# Patient Record
Sex: Female | Born: 1955 | Race: White | Hispanic: No | Smoking: Current every day smoker
Health system: Southern US, Community
[De-identification: ages and names within clinical notes are randomized; demographics above are authoritative.]

## PROBLEM LIST (undated history)

## (undated) DIAGNOSIS — Z86010 Personal history of colonic polyps: Secondary | ICD-10-CM

## (undated) DIAGNOSIS — J449 Chronic obstructive pulmonary disease, unspecified: Secondary | ICD-10-CM

## (undated) DIAGNOSIS — Z8619 Personal history of other infectious and parasitic diseases: Secondary | ICD-10-CM

## (undated) DIAGNOSIS — K219 Gastro-esophageal reflux disease without esophagitis: Secondary | ICD-10-CM

## (undated) DIAGNOSIS — W458XXS Other foreign body or object entering through skin, sequela: Secondary | ICD-10-CM

## (undated) DIAGNOSIS — J45909 Unspecified asthma, uncomplicated: Secondary | ICD-10-CM

## (undated) DIAGNOSIS — E039 Hypothyroidism, unspecified: Secondary | ICD-10-CM

## (undated) HISTORY — DX: Chronic obstructive pulmonary disease, unspecified: J44.9

## (undated) HISTORY — DX: Personal history of other infectious and parasitic diseases: Z86.19

## (undated) HISTORY — DX: Gastro-esophageal reflux disease without esophagitis: K21.9

## (undated) HISTORY — DX: Hypothyroidism, unspecified: E03.9

## (undated) HISTORY — PX: CERVICAL CONIZATION W/BX: SHX1330

## (undated) HISTORY — DX: Personal history of colonic polyps: Z86.010

## (undated) HISTORY — DX: Other foreign body or object entering through skin, sequela: W45.8XXS

## (undated) HISTORY — DX: Unspecified asthma, uncomplicated: J45.909

---
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
## 1986-03-21 HISTORY — PX: DILITATION & CURRETTAGE/HYSTROSCOPY WITH ESSURE: SHX5573

## 1998-03-21 HISTORY — PX: SPINE SURGERY: SHX786

## 2013-03-21 DIAGNOSIS — Z86010 Personal history of colonic polyps: Secondary | ICD-10-CM

## 2013-03-21 HISTORY — DX: Personal history of colonic polyps: Z86.010

## 2015-02-18 ENCOUNTER — Encounter: Payer: Self-pay | Admitting: Family

## 2015-02-18 ENCOUNTER — Ambulatory Visit (INDEPENDENT_AMBULATORY_CARE_PROVIDER_SITE_OTHER): Payer: Managed Care, Other (non HMO) | Admitting: Family

## 2015-02-18 VITALS — BP 112/61 | HR 78 | Temp 98.2°F | Resp 16 | Ht 64.0 in | Wt 112.0 lb

## 2015-02-18 DIAGNOSIS — Z720 Tobacco use: Secondary | ICD-10-CM | POA: Diagnosis not present

## 2015-02-18 DIAGNOSIS — Z23 Encounter for immunization: Secondary | ICD-10-CM

## 2015-02-18 DIAGNOSIS — K219 Gastro-esophageal reflux disease without esophagitis: Secondary | ICD-10-CM

## 2015-02-18 DIAGNOSIS — J449 Chronic obstructive pulmonary disease, unspecified: Secondary | ICD-10-CM | POA: Diagnosis not present

## 2015-02-18 DIAGNOSIS — E039 Hypothyroidism, unspecified: Secondary | ICD-10-CM

## 2015-02-18 MED ORDER — BUDESONIDE-FORMOTEROL FUMARATE 80-4.5 MCG/ACT IN AERO: 2.0000 | INHALATION_SPRAY | Freq: Two times a day (BID) | RESPIRATORY_TRACT | Status: AC

## 2015-02-18 MED ORDER — ALBUTEROL SULFATE HFA 108 (90 BASE) MCG/ACT IN AERS: 2.0000 | INHALATION_SPRAY | Freq: Four times a day (QID) | RESPIRATORY_TRACT | Status: AC | PRN

## 2015-02-18 NOTE — Progress Notes (Signed)
Subjective:    Patient ID: Jillian Sanford, female    DOB: 06/10/1955, 59 y.o.   MRN: [id number]  HPI  Jillian Sanford is a 59 yr old female who presents today to establish care. Has followed at [facility].    Pmhx is significant for the following:  COPD- reports that she was recently treated for pneumonia. She is requesting pneumovax.  Hypothyroid- currently maintained on synthroid. Was diagnosed 2004.  Has been stable on synthroid 100.    gerd-  She reports that she has had endoscopy and was told that she has Hiatal Hernia.  She uses otc omeprazole  once daily prn. Tries to avoid trigger foods.   Pneumonia end of June.   Tobacco abuse- began 20. Has smoked 3 PPD for a period of time, but 1PPD for many years.  Now smoking 12 cig a day  Review of Systems  Constitutional: Negative for unexpected weight change.  HENT: Negative for hearing loss and rhinorrhea.   Eyes:       Wears glasses  Respiratory:       Smokers cough at baseline  Cardiovascular: Negative for leg swelling.  Gastrointestinal: Positive for constipation. Negative for diarrhea.       Uses miralax almost daily, drinks a lot of water  Neurological:       Reports that she has some right sided HA's which she attributes to dental issue (needs filling replaced)  Hematological: Negative for adenopathy.  Psychiatric/Behavioral:       Denies depression/anxiety   Past Medical History  Diagnosis Date  . Asthma   . COPD (chronic obstructive pulmonary disease) (HCC)   . GERD (gastroesophageal reflux disease)   . History of genital warts     resolved  . Hypothyroid   . History of colon polyps 2015    Sees Dr. Jasmine December  . Other foreign body or object entering through skin, sequela 1969    has tip of sewing needle in right knee    Social History   Social History  . Marital Status: Legally Separated    Spouse Name: N/A  . Number of Children: N/A  . Years of Education: N/A   Occupational History  . Not on  file.   Social History Main Topics  . Smoking status: Current Every Day Smoker -- 25 years  . Smokeless tobacco: Not on file     Comment: 10 cigarettes a day  . Alcohol Use: No  . Drug Use: No  . Sexual Activity: Not on file   Other Topics Concern  . Not on file   Social History Narrative   4 children- all daughters, 2 at home and 2 out of the house   She has 3 grand children and 1 great grandchild   Customer service specialist at [employer]   About to become divorced   Enjoys shopping.    Past Surgical History  Procedure Laterality Date  . Cervical conization w/bx  1986, 1988  . Spine surgery  2000    "slipped disc lower spine"  . Dilitation & currettage/hystroscopy with essure  1988    ectopic pregnancy    Family History  Problem Relation Age of Onset  . Arthritis Mother     rheumatoid  . Hypertension Mother   . Cancer Sister 24    uterine cancer  . Cancer Brother     lymphoma?  . Cancer Paternal Uncle     colon  . Arthritis Maternal Grandmother   . Diabetes  Grandchild     Allergies  Allergen Reactions  . Penicillins Swelling and Rash    Throat swelling    No current outpatient prescriptions on file prior to visit.   No current facility-administered medications on file prior to visit.    BP 112/61 mmHg  Pulse 78  Temp(Src) 98.2 F (36.8 C) (Oral)  Resp 16  Ht 5\' 4"  (1.626 m)  Wt 112 lb (50.803 kg)  BMI 19.22 kg/m2  SpO2 100%       Objective:   Physical Exam  Constitutional: She is oriented to person, place, and time. She appears well-developed and well-nourished.  HENT:  Head: Normocephalic and atraumatic.  Right Ear: Tympanic membrane and ear canal normal.  Left Ear: Tympanic membrane and ear canal normal.  Mouth/Throat: No oropharyngeal exudate or posterior oropharyngeal edema.  Eyes: No scleral icterus.  Cardiovascular: Normal rate, regular rhythm and normal heart sounds.   No murmur heard. Pulmonary/Chest: Effort  normal and breath sounds normal. No respiratory distress. She has no wheezes.  Abdominal: Soft. She exhibits no distension. There is no tenderness.  Musculoskeletal: She exhibits no edema.  Lymphadenopathy:    She has no cervical adenopathy.  Neurological: She is alert and oriented to person, place, and time.  Skin: Skin is warm and dry.  Psychiatric: She has a normal mood and affect. Her behavior is normal. Judgment and thought content normal.          Assessment & Plan:

## 2015-02-18 NOTE — Progress Notes (Signed)
Pre visit review using our clinic review tool, if applicable. No additional management support is needed unless otherwise documented below in the visit note. 

## 2015-02-18 NOTE — Patient Instructions (Addendum)
Please complete lab work prior to leaving. Stop dulera and proventil- start symbicort and proair.

## 2015-02-19 DIAGNOSIS — E039 Hypothyroidism, unspecified: Secondary | ICD-10-CM | POA: Insufficient documentation

## 2015-02-19 DIAGNOSIS — Z720 Tobacco use: Secondary | ICD-10-CM | POA: Insufficient documentation

## 2015-02-19 DIAGNOSIS — J449 Chronic obstructive pulmonary disease, unspecified: Secondary | ICD-10-CM | POA: Insufficient documentation

## 2015-02-19 DIAGNOSIS — K219 Gastro-esophageal reflux disease without esophagitis: Secondary | ICD-10-CM | POA: Insufficient documentation

## 2015-02-19 LAB — TSH: TSH: 0.4 u[IU]/mL (ref 0.35–4.50)

## 2015-02-19 NOTE — Assessment & Plan Note (Signed)
Clinically stable on synthroid, obtain TSH.  

## 2015-02-19 NOTE — Assessment & Plan Note (Signed)
Managed with dietary modification and prn otc prilosec.

## 2015-02-19 NOTE — Assessment & Plan Note (Signed)
Pt counseled on importance of smoking cessation.  She is not yet ready to quit.  Will refer for low dose CT chest for lung cancer screening.

## 2015-02-19 NOTE — Assessment & Plan Note (Signed)
D/c dulera and proventil for insurance purposes. Start symbicort and proair. Stable.

## 2015-02-22 ENCOUNTER — Other Ambulatory Visit: Payer: Self-pay | Admitting: Family

## 2015-02-22 NOTE — Telephone Encounter (Signed)
Lab work shows synthroid dose a little strong. I would like her to decrease to , repeat tsh in 6 weeks.

## 2015-02-23 NOTE — Telephone Encounter (Signed)
Left message for pt to return my call.

## 2015-02-24 ENCOUNTER — Encounter: Payer: Self-pay | Admitting: Family

## 2015-02-24 ENCOUNTER — Telehealth: Payer: Self-pay | Admitting: Family

## 2015-02-24 DIAGNOSIS — E039 Hypothyroidism, unspecified: Secondary | ICD-10-CM

## 2015-02-24 DIAGNOSIS — Z8701 Personal history of pneumonia (recurrent): Secondary | ICD-10-CM

## 2015-02-24 NOTE — Telephone Encounter (Signed)
Insurance declined screening chest CT.  Can we please request copy of records from her pneumonia episode back in June- specifically lung imaging?

## 2015-02-25 MED ORDER — LEVOTHYROXINE SODIUM 88 MCG PO TABS: 88.0000 ug | ORAL_TABLET | Freq: Every day | ORAL | Status: DC

## 2015-02-25 NOTE — Telephone Encounter (Signed)
Pt notified. See 02/24/15 patient email.

## 2015-02-25 NOTE — Telephone Encounter (Signed)
Sent mychart message to pt. Awaiting response. 

## 2015-02-27 ENCOUNTER — Telehealth: Payer: Self-pay | Admitting: Family

## 2015-02-27 ENCOUNTER — Ambulatory Visit (HOSPITAL_BASED_OUTPATIENT_CLINIC_OR_DEPARTMENT_OTHER)
Admission: RE | Admit: 2015-02-27 | Discharge: 2015-02-27 | Disposition: A | Discharge status: Home / Self Care | Payer: Managed Care, Other (non HMO) | Source: Ambulatory Visit | Attending: Family | Admitting: Family

## 2015-02-27 DIAGNOSIS — R918 Other nonspecific abnormal finding of lung field: Secondary | ICD-10-CM | POA: Insufficient documentation

## 2015-02-27 DIAGNOSIS — J45909 Unspecified asthma, uncomplicated: Secondary | ICD-10-CM | POA: Diagnosis not present

## 2015-02-27 DIAGNOSIS — J449 Chronic obstructive pulmonary disease, unspecified: Secondary | ICD-10-CM | POA: Insufficient documentation

## 2015-02-27 DIAGNOSIS — Z8701 Personal history of pneumonia (recurrent): Secondary | ICD-10-CM | POA: Insufficient documentation

## 2015-02-27 DIAGNOSIS — R05 Cough: Secondary | ICD-10-CM | POA: Diagnosis not present

## 2015-02-27 DIAGNOSIS — J9811 Atelectasis: Secondary | ICD-10-CM

## 2015-02-27 DIAGNOSIS — F17200 Nicotine dependence, unspecified, uncomplicated: Secondary | ICD-10-CM | POA: Diagnosis not present

## 2015-02-27 NOTE — Telephone Encounter (Signed)
X ray neg for pneumonia.  Note is made of some atelectasis (where some of the air sacs of the lung are deflated).  I would like her to repeat CXR in 3 weeks.

## 2015-02-27 NOTE — Telephone Encounter (Signed)
Left detailed message on pt's cell# re: below recommendation and to call if any questions. 

## 2015-02-27 NOTE — Telephone Encounter (Signed)
I reviewed her old x ray. We don't need CT at this point, just a follow up x ray. Order placed.

## 2015-02-27 NOTE — Telephone Encounter (Signed)
CXR result received and placed in PCP's yellow folder.

## 2015-02-27 NOTE — Telephone Encounter (Signed)
Sent mychart message to pt. 

## 2015-03-04 ENCOUNTER — Telehealth: Payer: Self-pay | Admitting: Family

## 2015-03-04 DIAGNOSIS — Z8701 Personal history of pneumonia (recurrent): Secondary | ICD-10-CM

## 2015-03-04 NOTE — Telephone Encounter (Signed)
Please advise below message? Last phone note said pt should have repeat CXR in 3 weeks?

## 2015-03-04 NOTE — Telephone Encounter (Signed)
I changed CT order.

## 2015-03-05 NOTE — Telephone Encounter (Signed)
Lets hold off on CT and plan to follow through with CXR in 3 weeks first. Thanks.

## 2015-03-05 NOTE — Telephone Encounter (Signed)
Imaging aware on hold

## 2015-03-20 ENCOUNTER — Ambulatory Visit (HOSPITAL_BASED_OUTPATIENT_CLINIC_OR_DEPARTMENT_OTHER)
Admission: RE | Admit: 2015-03-20 | Discharge: 2015-03-20 | Disposition: A | Discharge status: Home / Self Care | Payer: Managed Care, Other (non HMO) | Source: Ambulatory Visit | Attending: Family | Admitting: Family

## 2015-03-20 DIAGNOSIS — J449 Chronic obstructive pulmonary disease, unspecified: Secondary | ICD-10-CM | POA: Insufficient documentation

## 2015-03-20 DIAGNOSIS — J189 Pneumonia, unspecified organism: Secondary | ICD-10-CM | POA: Insufficient documentation

## 2015-03-20 DIAGNOSIS — J9811 Atelectasis: Secondary | ICD-10-CM | POA: Diagnosis present

## 2015-03-23 ENCOUNTER — Encounter: Payer: Self-pay | Admitting: Family

## 2015-04-03 ENCOUNTER — Encounter: Payer: Self-pay | Admitting: Family

## 2015-04-06 NOTE — Telephone Encounter (Signed)
Called pt. LVM for call back to reschedule lab appt.

## 2015-04-08 ENCOUNTER — Other Ambulatory Visit: Payer: Managed Care, Other (non HMO)

## 2015-04-14 NOTE — Telephone Encounter (Signed)
Pt left VM 1/23 3:54pm to schedule lab appt - called and left msg for her to contact us and wait on the line to schedule with a team member

## 2015-04-16 ENCOUNTER — Other Ambulatory Visit (INDEPENDENT_AMBULATORY_CARE_PROVIDER_SITE_OTHER): Payer: Managed Care, Other (non HMO)

## 2015-04-16 DIAGNOSIS — E039 Hypothyroidism, unspecified: Secondary | ICD-10-CM

## 2015-04-16 LAB — TSH: TSH: 15.5 u[IU]/mL — AB (ref 0.35–4.50)

## 2015-04-17 ENCOUNTER — Other Ambulatory Visit: Payer: Self-pay | Admitting: Family

## 2015-04-17 DIAGNOSIS — E039 Hypothyroidism, unspecified: Secondary | ICD-10-CM

## 2015-04-17 NOTE — Telephone Encounter (Signed)
Left message for pt to return my call.

## 2015-04-17 NOTE — Telephone Encounter (Signed)
Please let pt know that blood work shows thyroid medication is too low.  Please confirm that she has been taking and if so, lets increase from 88 mcg to 112 mcg, follow up tsh in 6 weeks dx hypothyroid.

## 2015-04-20 MED ORDER — LEVOTHYROXINE SODIUM 112 MCG PO TABS: 112.0000 ug | ORAL_TABLET | Freq: Every day | ORAL | Status: AC

## 2015-04-20 NOTE — Telephone Encounter (Signed)
Pt notified. Rx sent, lab appt scheduled for 06/02/15 at 8:45am and lab order entered.

## 2015-04-20 NOTE — Telephone Encounter (Signed)
Pt returned call and would like to be call back at [phone] (home phone)

## 2015-06-02 ENCOUNTER — Other Ambulatory Visit: Payer: Managed Care, Other (non HMO)

## 2017-09-30 IMAGING — DX DG CHEST 2V
2 series · 2 of 2 positions shown · non-contrast
Comparison: None in PACs

CLINICAL DATA: Onset of coughing 4 days ago, current smoker,
history of pneumonia in August 2014, history of asthma-Celsius OPD

EXAM:
CHEST  2 VIEW

[chest pa]
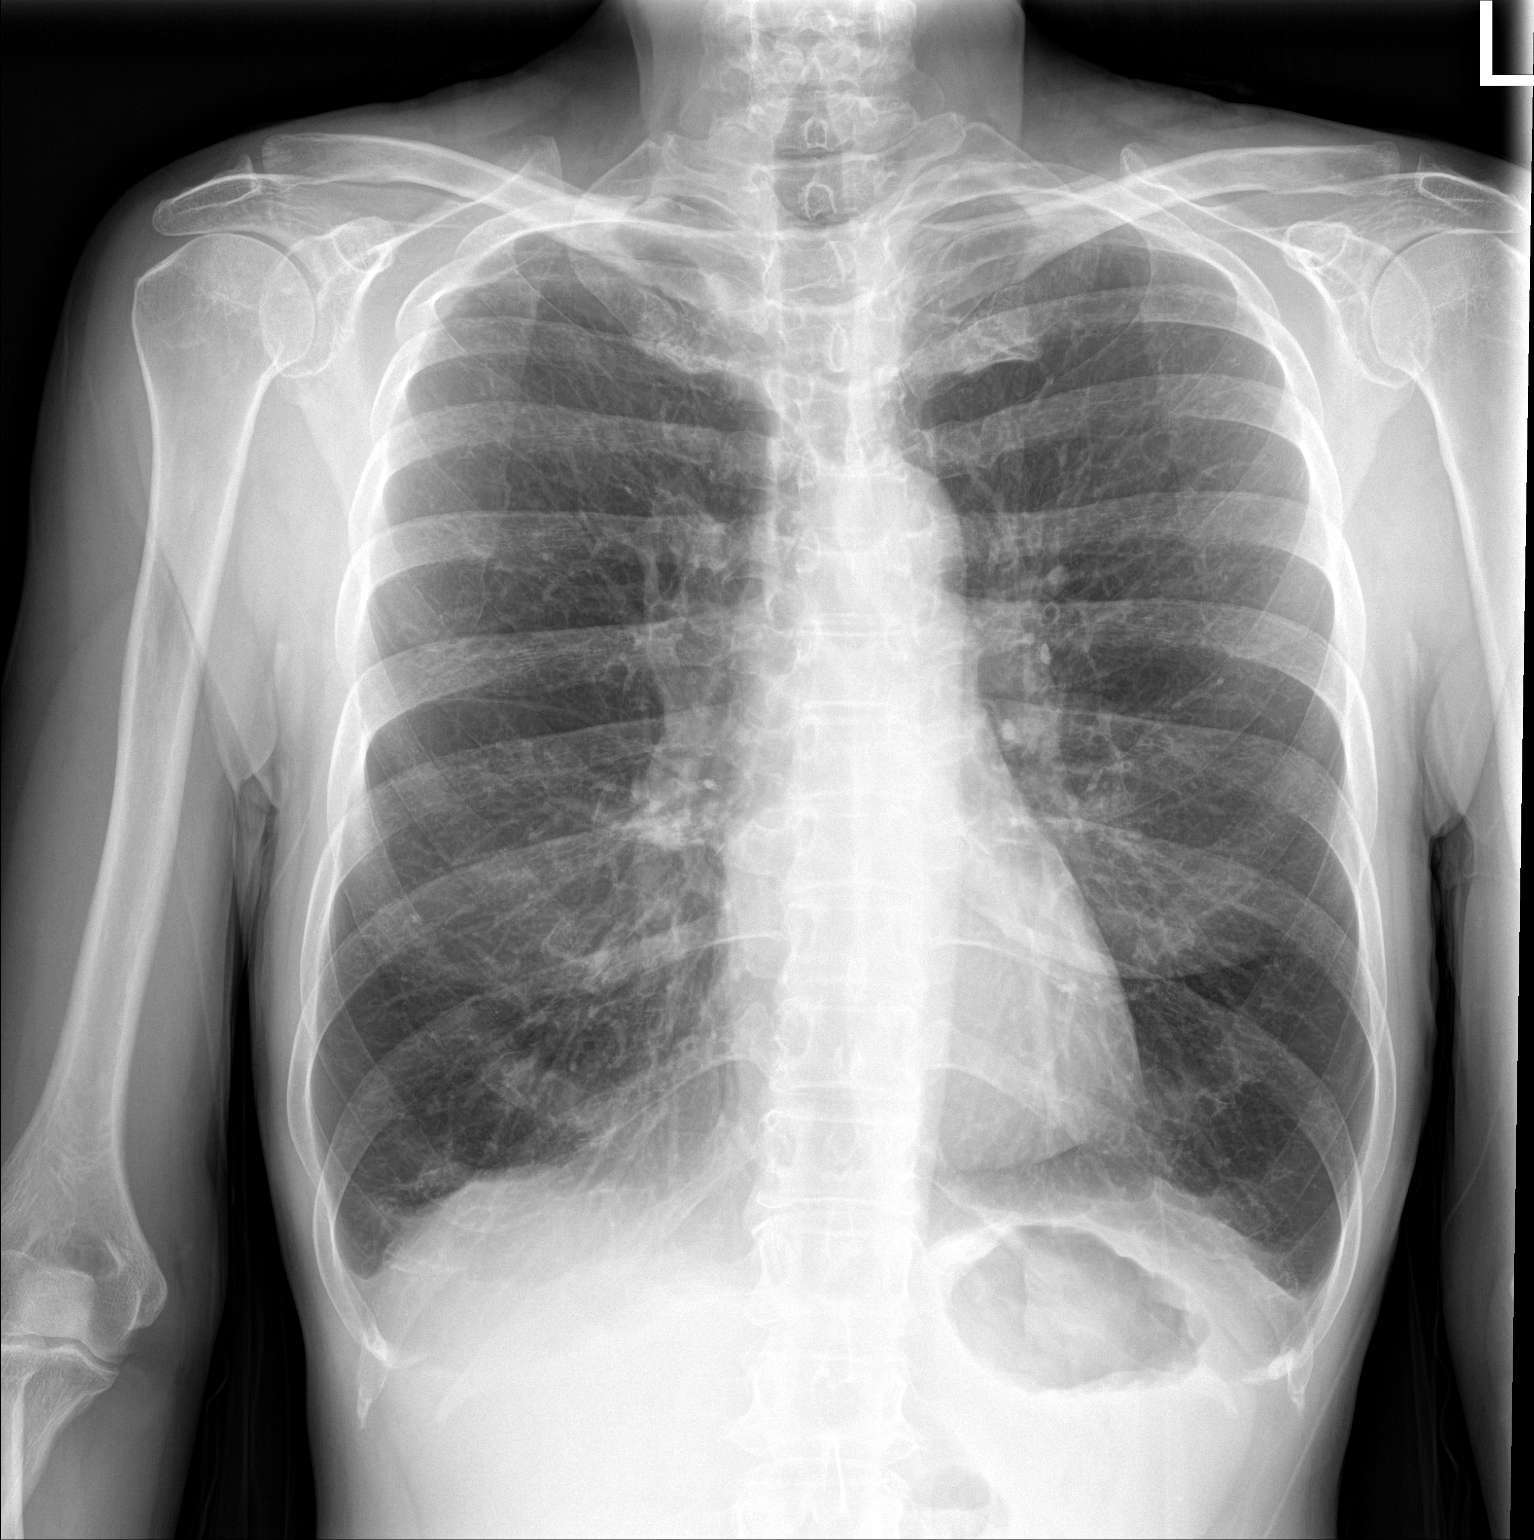

[chest lat]
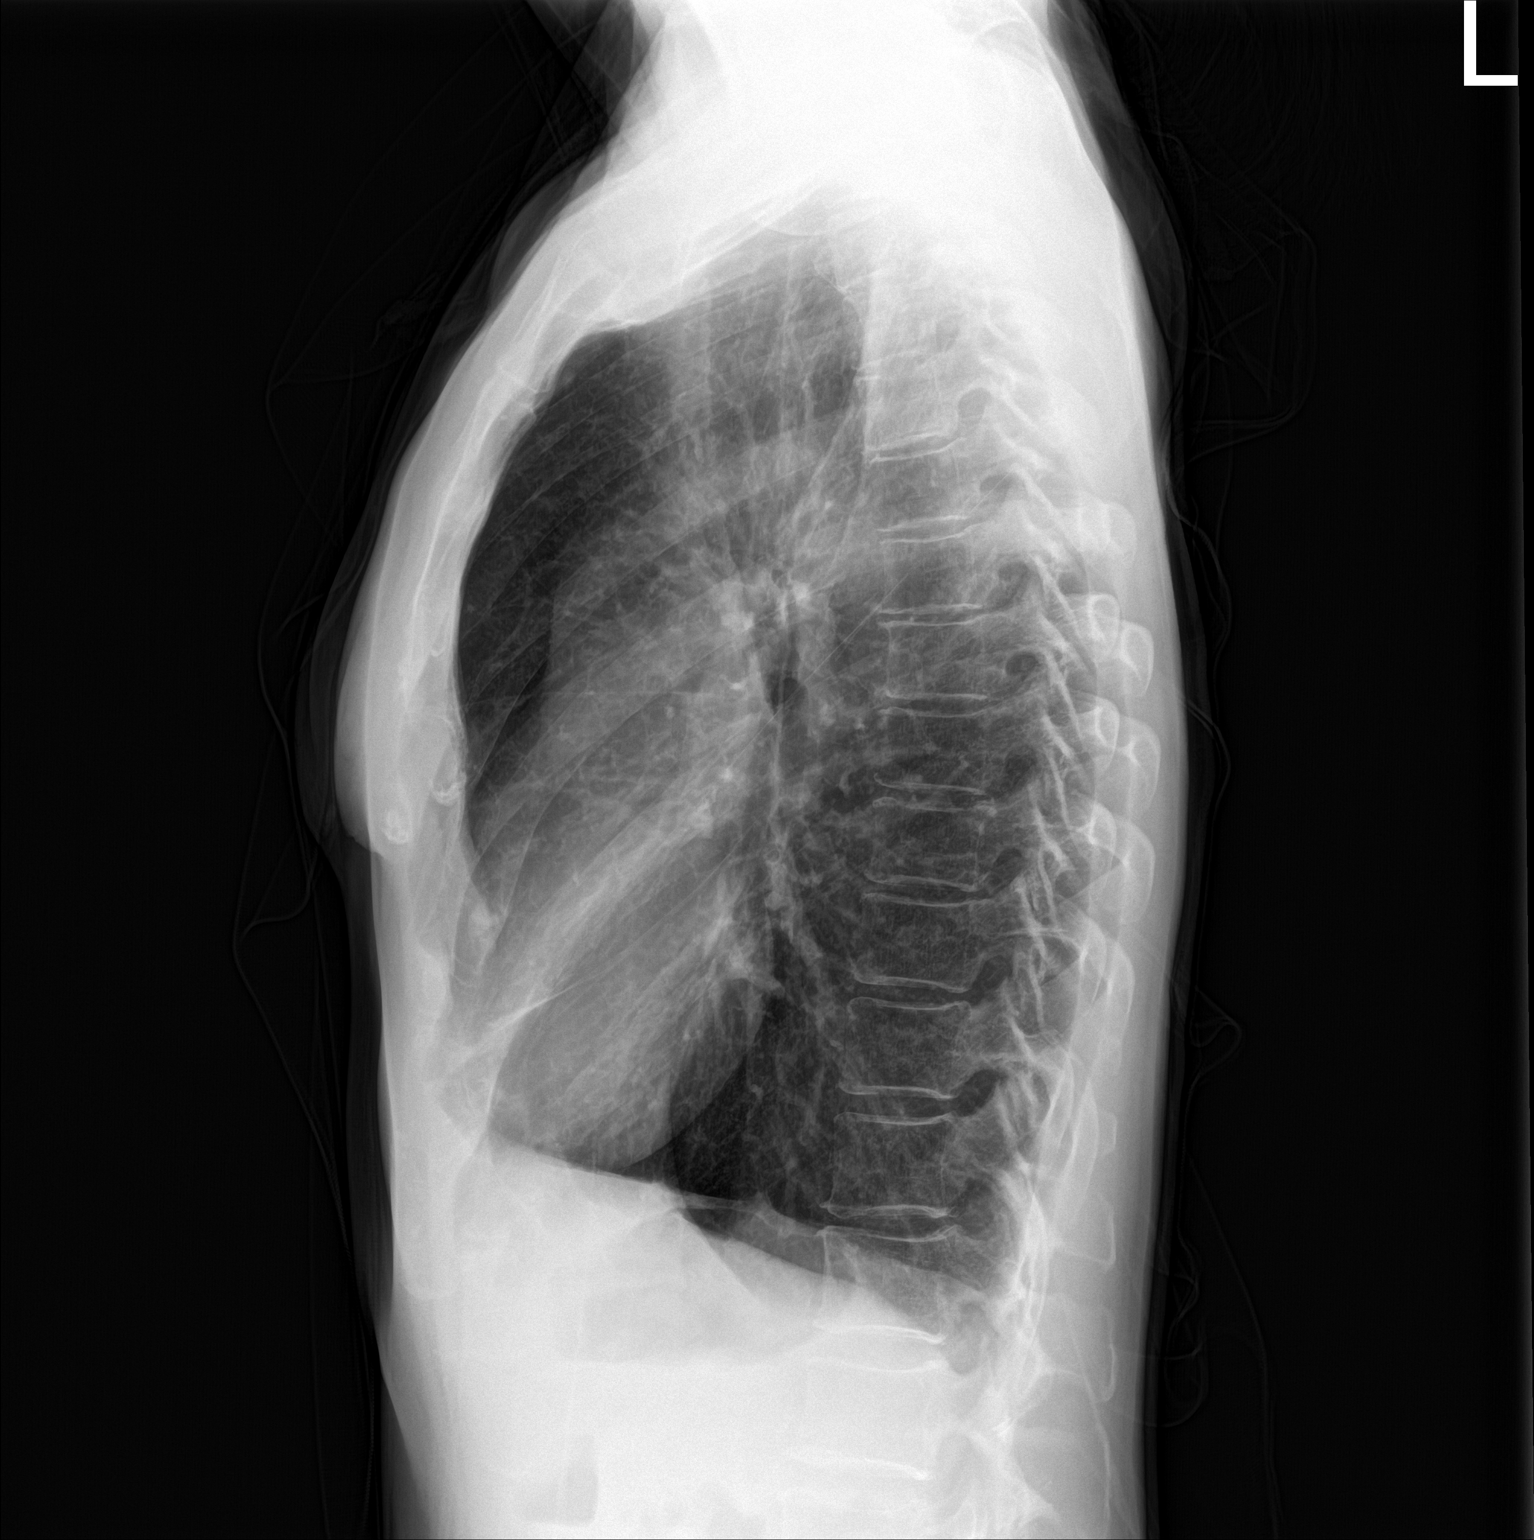

[2 of 2 positions shown; findings below may reference images not displayed]

FINDINGS: The lungs are hyperinflated with hemidiaphragm flattening. The
interstitial markings are coarse bilaterally. There is no alveolar
infiltrate. There is no pleural effusion or pneumothorax. The heart
and pulmonary vascularity are normal. The mediastinum is normal in
width. The bony thorax is unremarkable.
IMPRESSION: COPD. Mild interstitial prominence is consistent with the patient's
history of tobacco use. There may be subsegmental atelectasis
adjacent to the major fissure on the right.

Follow-up radiographs following anticipated antibiotic therapy are
recommended to assure clearing and exclude malignancy.

## 2017-10-21 IMAGING — DX DG CHEST 2V
2 series · 2 of 2 positions shown · non-contrast
Comparison: 02/27/2015

CLINICAL DATA: Recheck pneumonia.

EXAM:
CHEST  2 VIEW

[chest pa]
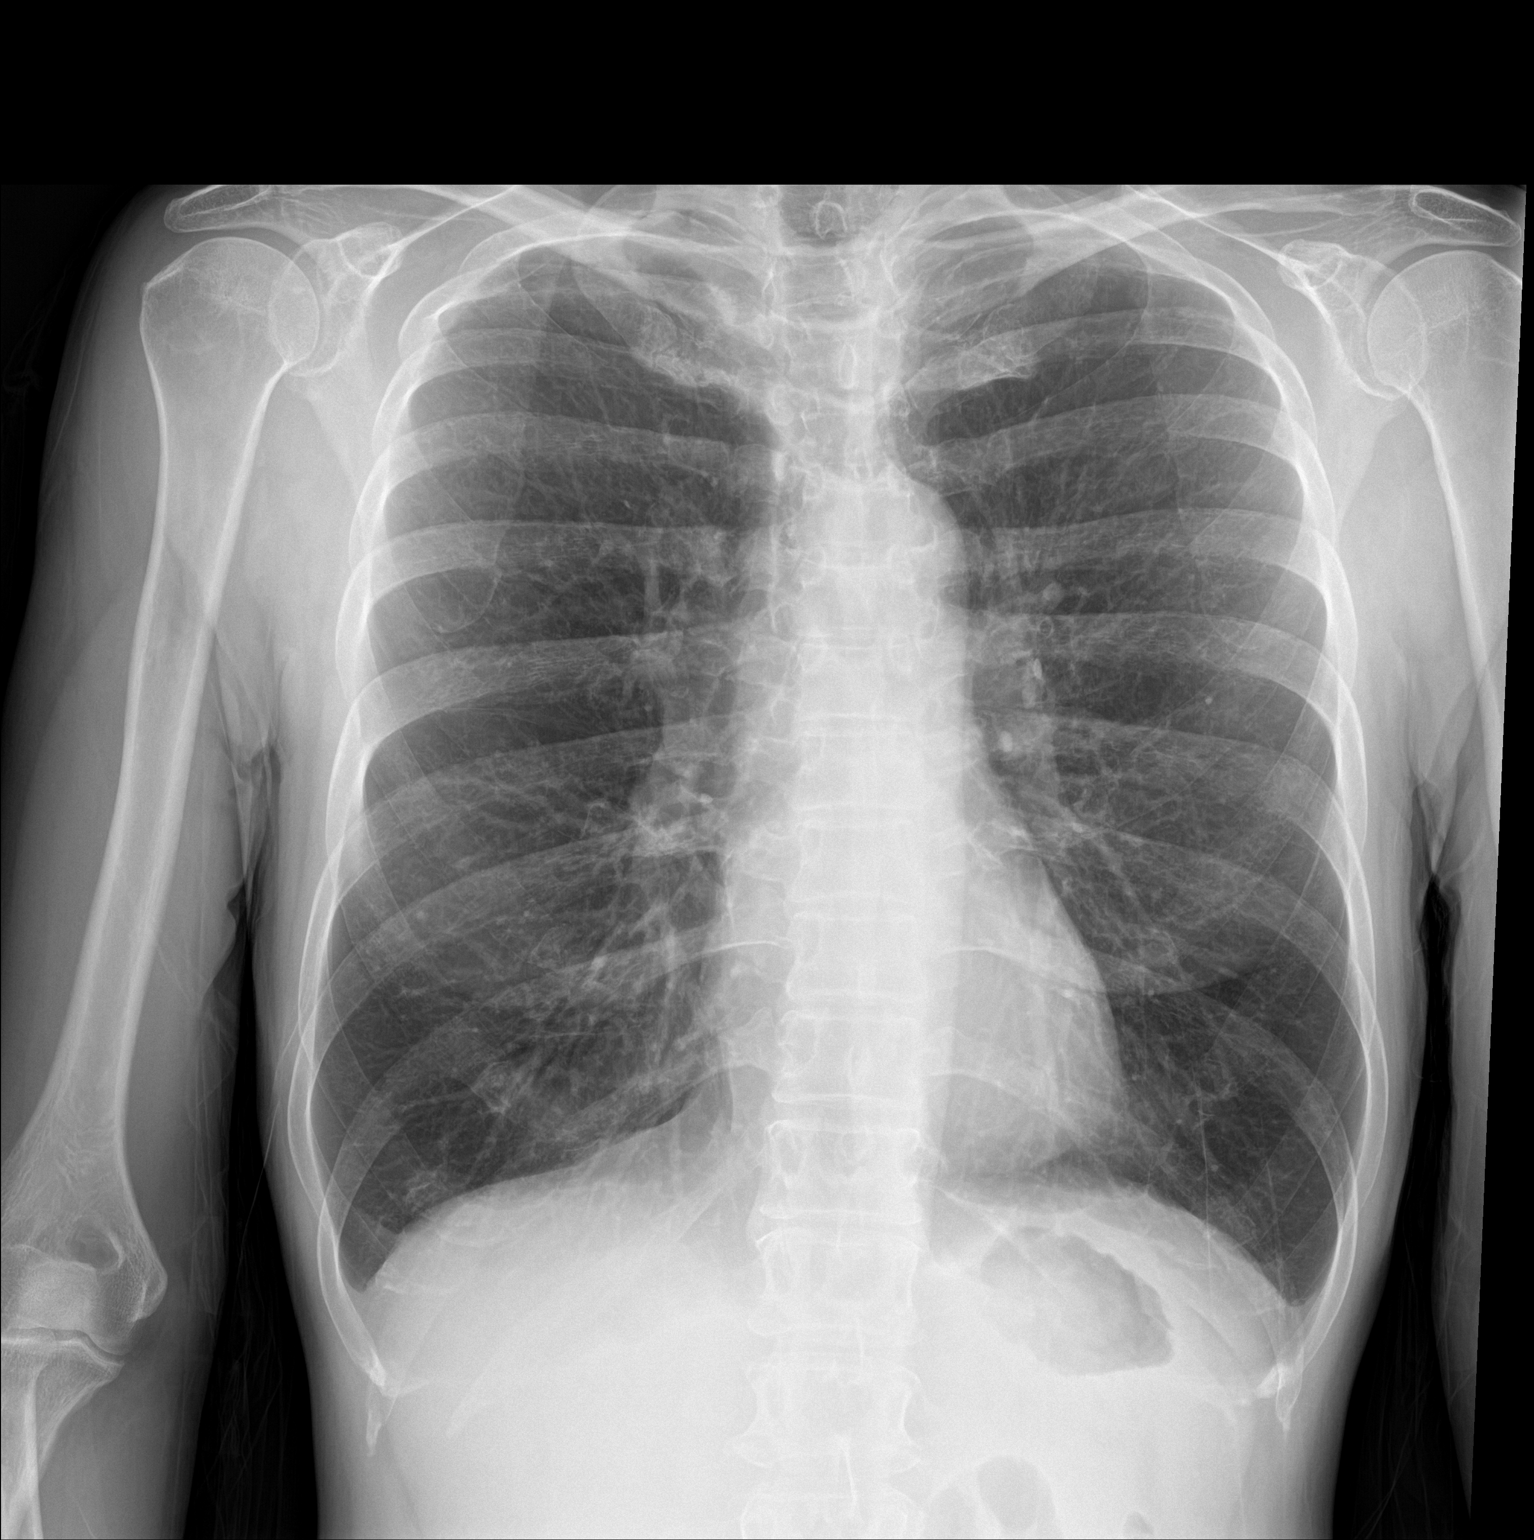

[chest lat]
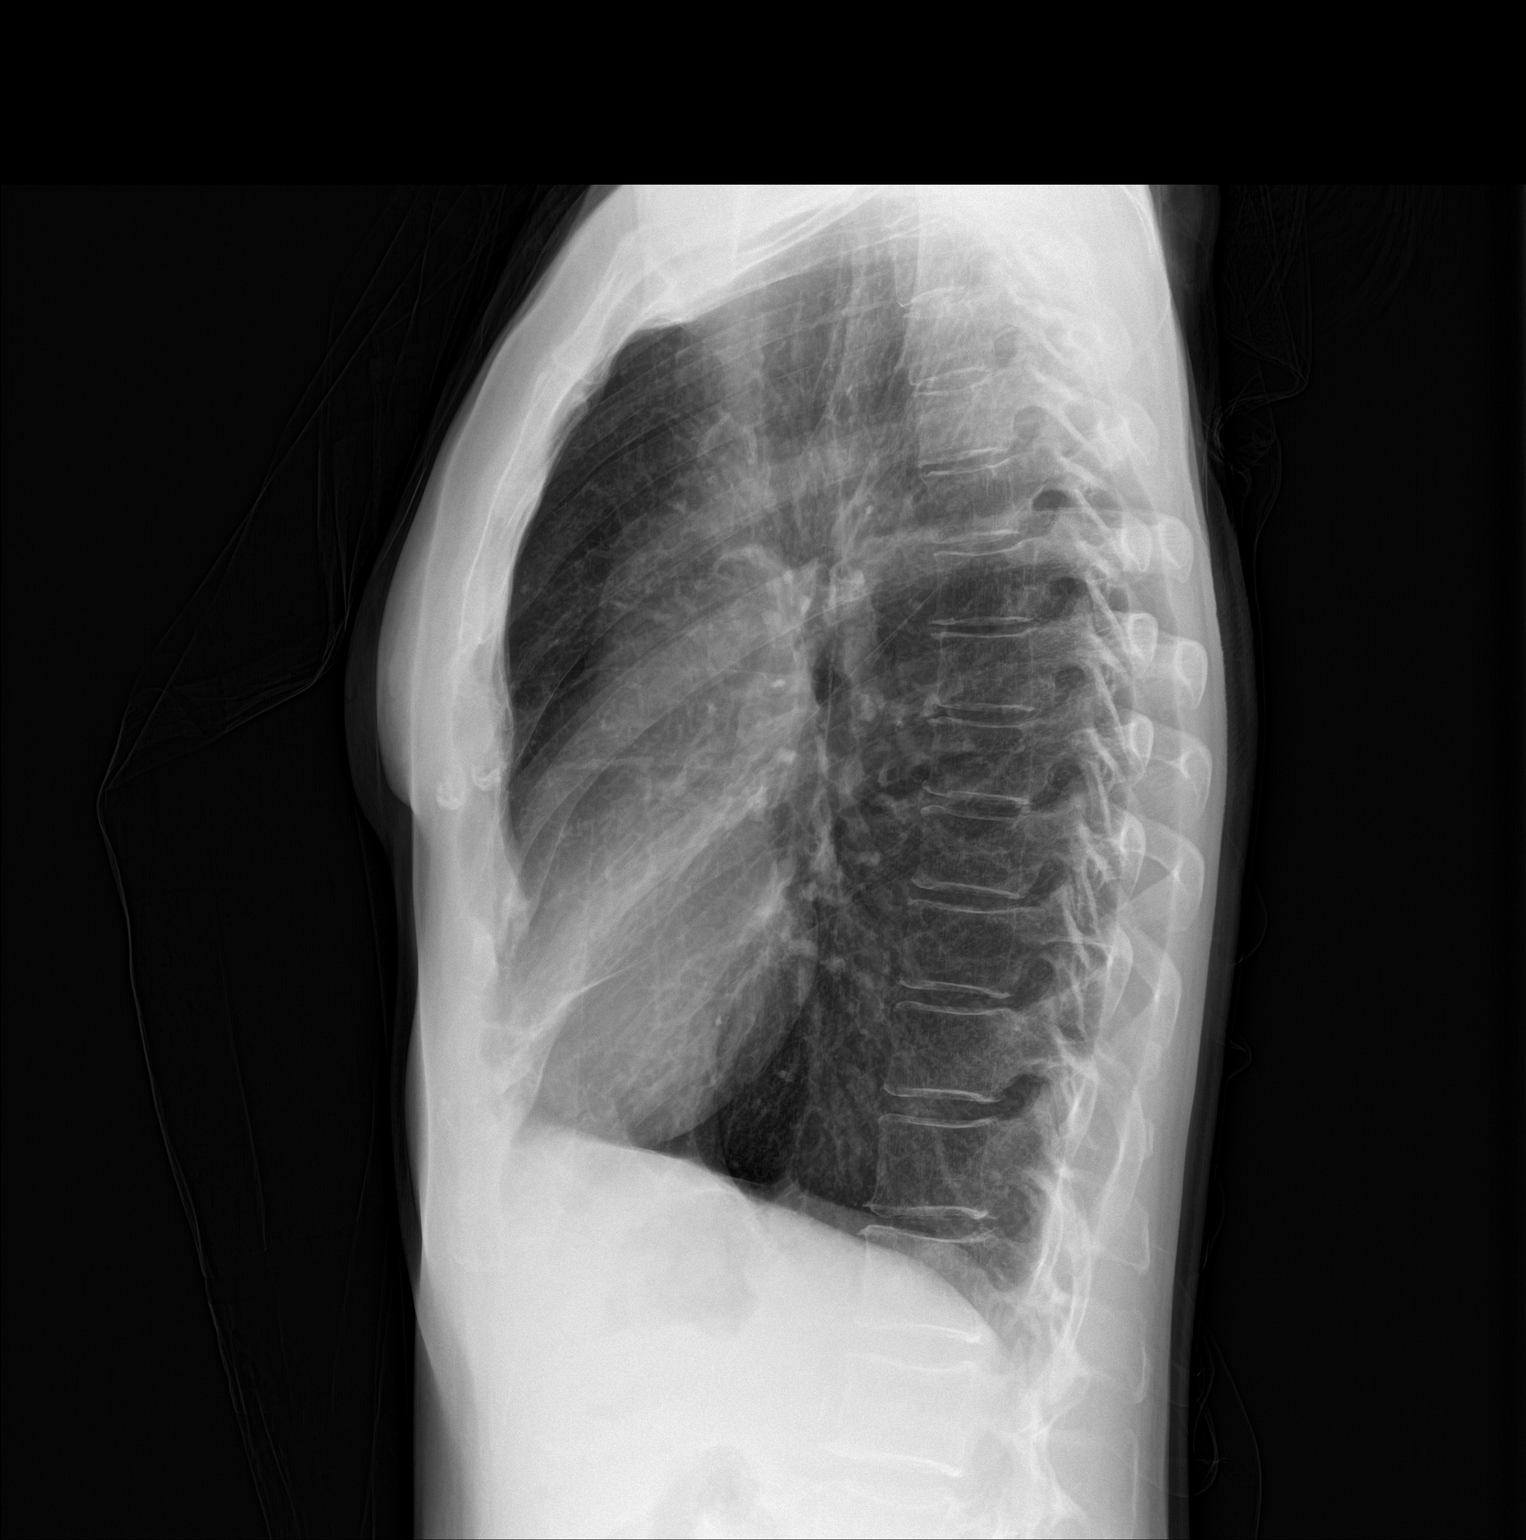

[2 of 2 positions shown; findings below may reference images not displayed]

FINDINGS: There is hyperinflation of the lungs compatible with COPD. Heart and
mediastinal contours are within normal limits. No focal opacities or
effusions. No acute bony abnormality.
IMPRESSION: COPD.  No active disease.
